# Patient Record
Sex: Female | Born: 1955 | Hispanic: No | Marital: Married | Smoking: Former smoker
Health system: Southern US, Community
[De-identification: ages and names within clinical notes are randomized; demographics above are authoritative.]

## PROBLEM LIST (undated history)

## (undated) DIAGNOSIS — M1990 Unspecified osteoarthritis, unspecified site: Secondary | ICD-10-CM

## (undated) DIAGNOSIS — K219 Gastro-esophageal reflux disease without esophagitis: Secondary | ICD-10-CM

## (undated) DIAGNOSIS — J45909 Unspecified asthma, uncomplicated: Secondary | ICD-10-CM

## (undated) HISTORY — PX: FOOT SURGERY: SHX648

## (undated) HISTORY — PX: TONSILLECTOMY: SUR1361

## (undated) HISTORY — PX: RHINOPLASTY: SUR1284

---

## 2010-09-14 ENCOUNTER — Ambulatory Visit: Payer: Self-pay | Admitting: Internal Medicine

## 2010-10-29 ENCOUNTER — Ambulatory Visit: Payer: Self-pay | Admitting: Internal Medicine

## 2011-03-01 ENCOUNTER — Emergency Department: Payer: Self-pay | Admitting: Emergency Medicine

## 2011-06-15 ENCOUNTER — Emergency Department: Payer: Self-pay | Admitting: Emergency Medicine

## 2011-09-30 DIAGNOSIS — G4700 Insomnia, unspecified: Secondary | ICD-10-CM | POA: Insufficient documentation

## 2011-09-30 DIAGNOSIS — M549 Dorsalgia, unspecified: Secondary | ICD-10-CM | POA: Insufficient documentation

## 2011-10-07 ENCOUNTER — Ambulatory Visit: Payer: Self-pay | Admitting: Internal Medicine

## 2013-04-15 ENCOUNTER — Ambulatory Visit: Payer: Self-pay | Admitting: Podiatry

## 2015-09-15 DIAGNOSIS — E782 Mixed hyperlipidemia: Secondary | ICD-10-CM | POA: Insufficient documentation

## 2016-07-11 ENCOUNTER — Ambulatory Visit (INDEPENDENT_AMBULATORY_CARE_PROVIDER_SITE_OTHER): Payer: Self-pay | Admitting: Vascular Surgery

## 2016-07-25 ENCOUNTER — Ambulatory Visit (INDEPENDENT_AMBULATORY_CARE_PROVIDER_SITE_OTHER): Payer: Self-pay | Admitting: Vascular Surgery

## 2018-09-01 ENCOUNTER — Telehealth: Payer: Self-pay | Admitting: Gastroenterology

## 2018-09-01 NOTE — Telephone Encounter (Signed)
Left vm for pt to call office and schedule apt for DR. Vanga Hemorrhoids, unspecified hemorrhoid type

## 2018-09-03 ENCOUNTER — Encounter (INDEPENDENT_AMBULATORY_CARE_PROVIDER_SITE_OTHER): Payer: Self-pay | Admitting: Vascular Surgery

## 2018-09-03 ENCOUNTER — Ambulatory Visit (INDEPENDENT_AMBULATORY_CARE_PROVIDER_SITE_OTHER): Payer: Managed Care, Other (non HMO) | Admitting: Vascular Surgery

## 2018-09-03 DIAGNOSIS — I872 Venous insufficiency (chronic) (peripheral): Secondary | ICD-10-CM | POA: Diagnosis not present

## 2018-09-03 DIAGNOSIS — I83819 Varicose veins of unspecified lower extremities with pain: Secondary | ICD-10-CM

## 2018-09-03 DIAGNOSIS — J4540 Moderate persistent asthma, uncomplicated: Secondary | ICD-10-CM

## 2018-09-09 ENCOUNTER — Encounter (INDEPENDENT_AMBULATORY_CARE_PROVIDER_SITE_OTHER): Payer: Self-pay | Admitting: Vascular Surgery

## 2018-09-09 DIAGNOSIS — I83819 Varicose veins of unspecified lower extremities with pain: Secondary | ICD-10-CM | POA: Insufficient documentation

## 2018-09-09 DIAGNOSIS — I872 Venous insufficiency (chronic) (peripheral): Secondary | ICD-10-CM | POA: Insufficient documentation

## 2018-09-09 DIAGNOSIS — J45909 Unspecified asthma, uncomplicated: Secondary | ICD-10-CM | POA: Insufficient documentation

## 2018-09-09 NOTE — Progress Notes (Signed)
MRN : [id number]  Kendra Hicks is a 63 y.o. (18-Aug-1955) female who presents with chief complaint of  Chief Complaint  Patient presents with  . Follow-up  .  History of Present Illness:   The patient returns for followup evaluation several years after the initial visit. The patient continues to have pain in the lower extremities with dependency. The pain is lessened with elevation. Graduated compression stockings, Class I (20-30 mmHg), have been worn but the stockings do not eliminate the leg pain. Over-the-counter analgesics do not improve the symptoms. The degree of discomfort continues to interfere with daily activities. The patient notes the pain in the legs is causing problems with daily exercise, at the workplace and even with household activities and maintenance such as standing in the kitchen preparing meals and doing dishes.   Venous ultrasound done 04/10/2016, shows normal deep venous system, no evidence of acute or chronic DVT.  Superficial reflux is present in the bilateral great saphenous veins  Current Meds  Medication Sig  . fluticasone (FLOVENT HFA) 110 MCG/ACT inhaler Inhale into the lungs 3 times/day as needed-between meals & bedtime.   . traZODone (DESYREL) 100 MG tablet Take by mouth at bedtime as needed.   . VENTOLIN HFA 108 (90 Base) MCG/ACT inhaler INHALE 1 PUFF INTO THE LUNGS AS NEEDED AS DIRECTED    History reviewed. No pertinent past medical history.  History reviewed. No pertinent surgical history.  Social History Social History   Tobacco Use  . Smoking status: Former Games developer  . Smokeless tobacco: Never Used  Substance Use Topics  . Alcohol use: Not on file  . Drug use: Not on file    Family History History reviewed. No pertinent family history.  No Known Allergies   REVIEW OF SYSTEMS (Negative unless checked)  Constitutional: [] Weight loss  [] Fever  [] Chills Cardiac: [] Chest pain   [] Chest pressure   [] Palpitations   [] Shortness of breath  when laying flat   [] Shortness of breath with exertion. Vascular:  [] Pain in legs with walking   [x] Pain in legs with standing  [] History of DVT   [] Phlebitis   [x] Swelling in legs   [x] Varicose veins   [] Non-healing ulcers Pulmonary:   [] Uses home oxygen   [] Productive cough   [] Hemoptysis   [] Wheeze  [] COPD   [] Asthma Neurologic:  [] Dizziness   [] Seizures   [] History of stroke   [] History of TIA  [] Aphasia   [] Vissual changes   [] Weakness or numbness in arm   [] Weakness or numbness in leg Musculoskeletal:   [] Joint swelling   [] Joint pain   [] Low back pain Hematologic:  [] Easy bruising  [] Easy bleeding   [] Hypercoagulable state   [] Anemic Gastrointestinal:  [] Diarrhea   [] Vomiting  [] Gastroesophageal reflux/heartburn   [] Difficulty swallowing. Genitourinary:  [] Chronic kidney disease   [] Difficult urination  [] Frequent urination   [] Blood in urine Skin:  [x] Rashes   [] Ulcers  Psychological:  [] History of anxiety   []  History of major depression.  Physical Examination  Vitals:   09/03/18 1344  BP: (!) 145/80  Pulse: 85  Resp: 16  Weight: 156 lb 3.2 oz (70.9 kg)  Height: 5\' 3"  (1.6 m)   Body mass index is 27.67 kg/m. Gen: WD/WN, NAD Head: [place]/AT, No temporalis wasting.  Ear/Nose/Throat: Hearing grossly intact, nares w/o erythema or drainage Eyes: PER, EOMI, sclera nonicteric.  Neck: Supple, no large masses.   Pulmonary:  Good air movement, no audible wheezing bilaterally, no use of accessory muscles.  Cardiac: RRR, no  JVD Vascular: Large varicosities present extensively greater than 10 mm bilaterally.  Mild venous stasis changes to the legs bilaterally.  2+ soft pitting edema Vessel Right Left  Radial Palpable Palpable  PT Palpable Palpable  DP Palpable Palpable  Gastrointestinal: Non-distended. No guarding/no peritoneal signs.  Musculoskeletal: M/S 5/5 throughout.  No deformity or atrophy.  Neurologic: CN 2-12 intact. Symmetrical.  Speech is fluent. Motor exam as listed  above. Psychiatric: Judgment intact, Mood & affect appropriate for pt's clinical situation. Dermatologic: Venous rashes no ulcers noted.  No changes consistent with cellulitis. Lymph : No lichenification or skin changes of chronic lymphedema.  CBC No results found for: WBC, HGB, HCT, MCV, PLT  BMET No results found for: NA, K, CL, CO2, GLUCOSE, BUN, CREATININE, CALCIUM, GFRNONAA, GFRAA CrCl cannot be calculated (No successful lab value found.).  COAG No results found for: INR, PROTIME  Radiology No results found.  Assessment/Plan 1. Varicose veins with pain Recommend  I have reviewed my previous  discussion with the patient regarding  varicose veins and why they cause symptoms. Patient will continue  wearing graduated compression stockings class 1 on a daily basis, beginning first thing in the morning and removing them in the evening.    In addition, behavioral modification including elevation during the day was again discussed and this will continue.  The patient has utilized over the counter pain medications and has been exercising.  However, at this time conservative therapy has not alleviated the patient's symptoms of leg pain and swelling  Recommend: laser ablation of the left and right great saphenous veins to eliminate the symptoms of pain and swelling of the lower extremities caused by the severe superficial venous reflux disease.  Left leg is worse and will be treated first.   2. Chronic venous insufficiency No surgery or intervention at this point in time.    I have had a long discussion with the patient regarding venous insufficiency and why it  causes symptoms. I have discussed with the patient the chronic skin changes that accompany venous insufficiency and the long term sequela such as infection and ulceration.  Patient will begin wearing graduated compression stockings class 1 (20-30 mmHg) or compression wraps on a daily basis a prescription was given. The patient  will put the stockings on first thing in the morning and removing them in the evening. The patient is instructed specifically not to sleep in the stockings.    In addition, behavioral modification including several periods of elevation of the lower extremities during the day will be continued. I have demonstrated that proper elevation is a position with the ankles at heart level.  The patient is instructed to begin routine exercise, especially walking on a daily basis   3. Moderate persistent chronic asthma without complication Continue pulmonary medications and aerosols as already ordered, these medications have been reviewed and there are no changes at this time.      Levora Dredge, MD  09/09/2018 5:09 PM

## 2018-09-11 ENCOUNTER — Encounter: Payer: Self-pay | Admitting: Gastroenterology

## 2018-09-11 ENCOUNTER — Encounter: Payer: Self-pay | Admitting: *Deleted

## 2018-09-11 ENCOUNTER — Ambulatory Visit (INDEPENDENT_AMBULATORY_CARE_PROVIDER_SITE_OTHER): Payer: Managed Care, Other (non HMO) | Admitting: Gastroenterology

## 2018-09-11 ENCOUNTER — Other Ambulatory Visit: Payer: Self-pay

## 2018-09-11 VITALS — BP 124/87 | HR 81 | Resp 17 | Ht 63.0 in | Wt 156.6 lb

## 2018-09-11 DIAGNOSIS — J301 Allergic rhinitis due to pollen: Secondary | ICD-10-CM | POA: Insufficient documentation

## 2018-09-11 DIAGNOSIS — K219 Gastro-esophageal reflux disease without esophagitis: Secondary | ICD-10-CM | POA: Insufficient documentation

## 2018-09-11 DIAGNOSIS — K625 Hemorrhage of anus and rectum: Secondary | ICD-10-CM

## 2018-09-11 NOTE — Progress Notes (Signed)
Kendra Repress, MD [address]  [address]  [place], [place] [address]  Main: [phone]  Fax: [phone]    Gastroenterology Consultation  Referring Provider:     Kandyce Rud, MD Primary Care Physician:  Kandyce Rud, MD Primary Gastroenterologist:  Dr. Arlyss Hicks Reason for Consultation:     Rectal bleeding        HPI:   Kendra Hicks is a 63 y.o. female referred by Dr. Kandyce Rud, MD  for consultation & management of rectal bleeding for more than 20 years.  She reports seeing bright red blood per rectum mostly on wiping and sometimes dripping in the toilet bowl, associated with leakage, mucus discharge, rectal pressure/discomfort.  She used to apply anti-hemorrhoidal creams but does not like them because she feels it is messy when she uses the creams. Patient had right anterior hemorrhoidectomy on 01/18/2011 at [facility].  She also reports soft, brown bowel movements or eating certain foods.  She does not have anemia.  She denies abdominal pain, bloating, constipation.  She tries to stay active, does hiking on a regular basis.  NSAIDs: Takes aspirin 325 mg or Advil when she does hiking for musculoskeletal pain  Antiplts/Anticoagulants/Anti thrombotics: None  GI Procedures:  Colonoscopy 10/08/2010 Reportedly normal She denies family history of GI malignancy, inflammatory bowel disease, celiac disease She does not smoke or drink alcohol  No past medical history on file.  No past surgical history on file.  Current Outpatient Medications:  .  fluticasone (FLOVENT HFA) 110 MCG/ACT inhaler, Inhale into the lungs 3 times/day as needed-between meals & bedtime. , Disp: , Rfl:  .  traZODone (DESYREL) 100 MG tablet, Take by mouth at bedtime as needed. , Disp: , Rfl:  .  VENTOLIN HFA 108 (90 Base) MCG/ACT inhaler, INHALE 1 PUFF INTO THE LUNGS AS NEEDED AS DIRECTED, Disp: , Rfl:   No family history on file.   Social History   Tobacco Use  . Smoking status:  Former Games developer  . Smokeless tobacco: Never Used  Substance Use Topics  . Alcohol use: Not on file  . Drug use: Not on file    Allergies as of 09/11/2018  . (No Known Allergies)    Review of Systems:    All systems reviewed and negative except where noted in HPI.   Physical Exam:  BP 124/87 (BP Location: Left Arm, Patient Position: Sitting, Cuff Size: Normal)   Pulse 81   Resp 17   Ht 5\' 3"  (1.6 m)   Wt 156 lb 9.6 oz (71 kg)   BMI 27.74 kg/m  No LMP recorded.  General:   Alert,  Well-developed, well-nourished, pleasant and cooperative in NAD Head:  Normocephalic and atraumatic. Eyes:  Sclera clear, no icterus.   Conjunctiva pink. Ears:  Normal auditory acuity. Nose:  No deformity, discharge, or lesions. Mouth:  No deformity or lesions,oropharynx pink & moist. Neck:  Supple; no masses or thyromegaly. Lungs:  Respirations even and unlabored.  Clear throughout to auscultation.   No wheezes, crackles, or rhonchi. No acute distress. Heart:  Regular rate and rhythm; no murmurs, clicks, rubs, or gallops. Abdomen:  Normal bowel sounds. Soft, non-tender and non-distended without masses, hepatosplenomegaly or hernias noted.  No guarding or rebound tenderness.   Rectal: Not performed Msk:  Symmetrical without gross deformities. Good, equal movement & strength bilaterally. Pulses:  Normal pulses noted. Extremities:  No clubbing or edema.  No cyanosis. Neurologic:  Alert and oriented x3;  grossly normal neurologically. Skin:  Intact without significant lesions or rashes. No jaundice. Psych:  Alert and cooperative. Normal mood and affect.  Imaging Studies: No abdominal imaging  Assessment and Plan:   Kendra Hicks is a 63 y.o. female with no significant past medical history, presents with several years history of bright red bleeding per rectum associated with rectal pain/discomfort, leakage.  Her symptoms are most likely secondary to symptomatic hemorrhoids.  However, her last  colonoscopy was 8 years ago.  Recommend flexible sigmoidoscopy to evaluate for any left colon/rectal lesions that can explain bright red bleeding per rectum other than hemorrhoids  Recommend flexible sigmoidoscopy under anesthesia, take left colon biopsies to rule out microscopic colitis Discussed with her about outpatient hemorrhoid ligation after flex sig Obtain colonoscopy report from 2012 Minimize intake of NSAID use   Follow up in 2 weeks   Kendra Repress, MD

## 2018-09-14 ENCOUNTER — Ambulatory Visit
Admission: RE | Admit: 2018-09-14 | Discharge: 2018-09-14 | Disposition: A | Discharge status: Home / Self Care | Payer: Managed Care, Other (non HMO) | Attending: Gastroenterology | Admitting: Gastroenterology

## 2018-09-14 ENCOUNTER — Ambulatory Visit: Payer: Managed Care, Other (non HMO) | Admitting: Certified Registered"

## 2018-09-14 ENCOUNTER — Encounter
Admission: RE | Disposition: A | Discharge status: Home / Self Care | Payer: Self-pay | Source: Home / Self Care | Attending: Gastroenterology

## 2018-09-14 ENCOUNTER — Encounter: Payer: Self-pay | Admitting: Student

## 2018-09-14 ENCOUNTER — Other Ambulatory Visit: Payer: Self-pay

## 2018-09-14 DIAGNOSIS — Z87891 Personal history of nicotine dependence: Secondary | ICD-10-CM | POA: Diagnosis not present

## 2018-09-14 DIAGNOSIS — K219 Gastro-esophageal reflux disease without esophagitis: Secondary | ICD-10-CM | POA: Diagnosis not present

## 2018-09-14 DIAGNOSIS — M1990 Unspecified osteoarthritis, unspecified site: Secondary | ICD-10-CM | POA: Diagnosis not present

## 2018-09-14 DIAGNOSIS — Z7951 Long term (current) use of inhaled steroids: Secondary | ICD-10-CM | POA: Diagnosis not present

## 2018-09-14 DIAGNOSIS — K644 Residual hemorrhoidal skin tags: Secondary | ICD-10-CM | POA: Insufficient documentation

## 2018-09-14 DIAGNOSIS — J45909 Unspecified asthma, uncomplicated: Secondary | ICD-10-CM | POA: Insufficient documentation

## 2018-09-14 DIAGNOSIS — K642 Third degree hemorrhoids: Secondary | ICD-10-CM | POA: Diagnosis not present

## 2018-09-14 DIAGNOSIS — K625 Hemorrhage of anus and rectum: Secondary | ICD-10-CM | POA: Insufficient documentation

## 2018-09-14 DIAGNOSIS — Z79899 Other long term (current) drug therapy: Secondary | ICD-10-CM | POA: Insufficient documentation

## 2018-09-14 DIAGNOSIS — K529 Noninfective gastroenteritis and colitis, unspecified: Secondary | ICD-10-CM | POA: Diagnosis not present

## 2018-09-14 HISTORY — PX: FLEXIBLE SIGMOIDOSCOPY: SHX5431

## 2018-09-14 HISTORY — DX: Unspecified asthma, uncomplicated: J45.909

## 2018-09-14 HISTORY — DX: Gastro-esophageal reflux disease without esophagitis: K21.9

## 2018-09-14 HISTORY — DX: Unspecified osteoarthritis, unspecified site: M19.90

## 2018-09-14 SURGERY — SIGMOIDOSCOPY, FLEXIBLE
Anesthesia: General

## 2018-09-14 MED ORDER — PROPOFOL 10 MG/ML IV BOLUS
INTRAVENOUS | Status: DC | PRN
  Administered 2018-09-14 (×3): 50 mg via INTRAVENOUS

## 2018-09-14 MED ORDER — SODIUM CHLORIDE 0.9 % IV SOLN
INTRAVENOUS | Status: DC
  Administered 2018-09-14: 1000 mL via INTRAVENOUS

## 2018-09-14 NOTE — Transfer of Care (Signed)
Immediate Anesthesia Transfer of Care Note  Patient: Kendra Hicks  Procedure(s) Performed: FLEXIBLE SIGMOIDOSCOPY (N/A )  Patient Location: Endoscopy Unit  Anesthesia Type:General  Level of Consciousness: drowsy and responds to stimulation  Airway & Oxygen Therapy: Patient Spontanous Breathing and Patient connected to nasal cannula oxygen  Post-op Assessment: Report given to RN and Post -op Vital signs reviewed and stable  Post vital signs: Reviewed and stable  Last Vitals:  Vitals Value Taken Time  BP 100/72 09/14/2018 11:12 AM  Temp 36.1 C 09/14/2018 11:11 AM  Pulse 78 09/14/2018 11:13 AM  Resp 13 09/14/2018 11:13 AM  SpO2 99 % 09/14/2018 11:13 AM  Vitals shown include unvalidated device data.  Last Pain:  Vitals:   09/14/18 1111  TempSrc: Tympanic  PainSc: 0-No pain         Complications: No apparent anesthesia complications

## 2018-09-14 NOTE — Op Note (Signed)
[facility] Gastroenterology Patient Name: Kendra Hicks Procedure Date: 09/14/2018 10:24 AM MRN: [id number] Account #: [id number] Date of Birth: 1956/06/20 Admit Type: Outpatient Age: 63 Room: [facility] ENDO ROOM 3 Gender: Female Note Status: Finalized Procedure:            Flexible Sigmoidoscopy Indications:          Rectal hemorrhage, Chronic diarrhea Providers:            Lin Landsman MD, MD Referring MD:         Caprice Renshaw MD (Referring MD) Medicines:            Monitored Anesthesia Care Complications:        No immediate complications. Estimated blood loss: None. Procedure:            Pre-Anesthesia Assessment:                       - Prior to the procedure, a History and Physical was                        performed, and patient medications and allergies were                        reviewed. The patient is competent. The risks and                        benefits of the procedure and the sedation options and                        risks were discussed with the patient. All questions                        were answered and informed consent was obtained.                        Patient identification and proposed procedure were                        verified by the physician, the nurse, the                        anesthesiologist, the anesthetist and the technician in                        the pre-procedure area in the procedure room in the                        endoscopy suite. Mental Status Examination: alert and                        oriented. Airway Examination: normal oropharyngeal                        airway and neck mobility. Respiratory Examination:                        clear to auscultation. CV Examination: normal.                        Prophylactic Antibiotics: The patient does not require  prophylactic antibiotics. Prior Anticoagulants: The                        patient has taken no previous anticoagulant or                         antiplatelet agents. ASA Grade Assessment: III - A                        patient with severe systemic disease. After reviewing                        the risks and benefits, the patient was deemed in                        satisfactory condition to undergo the procedure. The                        anesthesia plan was to use monitored anesthesia care                        (MAC). Immediately prior to administration of                        medications, the patient was re-assessed for adequacy                        to receive sedatives. The heart rate, respiratory rate,                        oxygen saturations, blood pressure, adequacy of                        pulmonary ventilation, and response to care were                        monitored throughout the procedure. The physical status                        of the patient was re-assessed after the procedure.                       After obtaining informed consent, the scope was passed                        under direct vision. The Endoscope was introduced                        through the anus and advanced to the the descending                        colon. The flexible sigmoidoscopy was accomplished                        without difficulty. The patient tolerated the procedure                        well. The quality of the bowel preparation was adequate. Findings:      Hemorrhoids were found on perianal exam.  Normal mucosa was found in the rectum, in the recto-sigmoid colon, in       the sigmoid colon and in the descending colon. Biopsies for histology       were taken with a cold forceps for evaluation of microscopic colitis.      Non-bleeding external and internal hemorrhoids were found during       retroflexion and during perianal exam. The hemorrhoids were large and       Grade III (internal hemorrhoids that prolapse but require manual       reduction). Impression:           - Hemorrhoids found on  perianal exam.                       - Normal mucosa in the rectum, in the recto-sigmoid                        colon, in the sigmoid colon and in the descending                        colon. Biopsied.                       - Non-bleeding external and internal hemorrhoids. Recommendation:       - Discharge patient to home (with escort).                       - Await pathology results.                       - Return to my office as previously scheduled for                        hemorrhoid ligation.                       - Resume previous diet today. Procedure Code(s):    --- Professional ---                       [phone], Sigmoidoscopy, flexible; with biopsy, single or                        multiple Diagnosis Code(s):    --- Professional ---                       K64.2, Third degree hemorrhoids                       K62.5, Hemorrhage of anus and rectum                       K52.9, Noninfective gastroenteritis and colitis,                        unspecified CPT copyright 2018 American Medical Association. All rights reserved. The codes documented in this report are preliminary and upon coder review may  be revised to meet current compliance requirements. Dr. Ulyess Mort Lin Landsman MD, MD 09/14/2018 11:12:52 AM This report has been signed electronically. Number of Addenda: 0 Note Initiated On: 09/14/2018 10:24 AM Total Procedure Duration: 0 hours 8 minutes 45 seconds  [facility]

## 2018-09-14 NOTE — H&P (Signed)
Arlyss Repress, MD [address]  [address]  [place], [place] [address]  Main: [phone]  Fax: [phone] Pager: [phone]  Primary Care Physician:  Kandyce Rud, MD Primary Gastroenterologist:  Dr. Arlyss Repress  Pre-Procedure History & Physical: HPI:  Kendra Hicks is a 64 y.o. female is here for an flexible sigmoidoscopy.   Past Medical History:  Diagnosis Date  . Arthritis   . Asthma   . GERD (gastroesophageal reflux disease)     Past Surgical History:  Procedure Laterality Date  . FOOT SURGERY    . RHINOPLASTY    . TONSILLECTOMY      Prior to Admission medications   Medication Sig Start Date End Date Taking? Authorizing Provider  traZODone (DESYREL) 100 MG tablet Take by mouth at bedtime as needed.  05/12/18  Yes [provider]  fluticasone (FLOVENT HFA) 110 MCG/ACT inhaler Inhale into the lungs 3 times/day as needed-between meals & bedtime.  05/12/18   [provider]  VENTOLIN HFA 108 (90 Base) MCG/ACT inhaler INHALE 1 PUFF INTO THE LUNGS AS NEEDED AS DIRECTED 07/18/18   [provider]    Allergies as of 09/11/2018  . (No Known Allergies)    History reviewed. No pertinent family history.  Social History   Socioeconomic History  . Marital status: Married    Spouse name: Not on file  . Number of children: Not on file  . Years of education: Not on file  . Highest education level: Not on file  Occupational History  . Not on file  Social Needs  . Financial resource strain: Not on file  . Food insecurity:    Worry: Not on file    Inability: Not on file  . Transportation needs:    Medical: Not on file    Non-medical: Not on file  Tobacco Use  . Smoking status: Former Games developer  . Smokeless tobacco: Never Used  Substance and Sexual Activity  . Alcohol use: Never    Frequency: Never  . Drug use: Never  . Sexual activity: Not on file  Lifestyle  . Physical activity:    Days per week: Not on file    Minutes  per session: Not on file  . Stress: Not on file  Relationships  . Social connections:    Talks on phone: Not on file    Gets together: Not on file    Attends religious service: Not on file    Active member of club or organization: Not on file    Attends meetings of clubs or organizations: Not on file    Relationship status: Not on file  . Intimate partner violence:    Fear of current or ex partner: Not on file    Emotionally abused: Not on file    Physically abused: Not on file    Forced sexual activity: Not on file  Other Topics Concern  . Not on file  Social History Narrative  . Not on file    Review of Systems: See HPI, otherwise negative ROS  Physical Exam: BP 100/72   Pulse 80   Temp (!) 97 F (36.1 C) (Tympanic)   Resp 13   Ht 5\' 3"  (1.6 m)   Wt 156 lb (70.8 kg)   SpO2 99%   BMI 27.63 kg/m  General:   Alert,  pleasant and cooperative in NAD Head:  Normocephalic and atraumatic. Neck:  Supple; no masses or thyromegaly. Lungs:  Clear throughout to auscultation.    Heart:  Regular rate and rhythm. Abdomen:  Soft, nontender and nondistended. Normal bowel sounds, without guarding, and without rebound.   Neurologic:  Alert and  oriented x4;  grossly normal neurologically.  Impression/Plan: Kendra Hicks is here for an flexible sigmoidoscopy to be performed for rectal bleeding  Risks, benefits, limitations, and alternatives regarding  flexible sigmoidoscopy have been reviewed with the patient.  Questions have been answered.  All parties agreeable.   Lannette Donath, MD  09/14/2018, 11:18 AM

## 2018-09-14 NOTE — Anesthesia Preprocedure Evaluation (Signed)
Anesthesia Evaluation  Patient identified by MRN, date of birth, ID band Patient awake    Reviewed: Allergy & Precautions, H&P , NPO status , Patient's Chart, lab work & pertinent test results  History of Anesthesia Complications Negative for: history of anesthetic complications  Airway Mallampati: III  TM Distance: <3 FB Neck ROM: full    Dental  (+) Chipped, Implants   Pulmonary asthma ,  Signs and symptoms suggestive of sleep apnea            Cardiovascular Exercise Tolerance: Good (-) angina(-) Past MI and (-) DOE negative cardio ROS       Neuro/Psych negative neurological ROS  negative psych ROS   GI/Hepatic Neg liver ROS, GERD  Medicated and Controlled,  Endo/Other  negative endocrine ROS  Renal/GU negative Renal ROS  negative genitourinary   Musculoskeletal   Abdominal   Peds  Hematology negative hematology ROS (+)   Anesthesia Other Findings Past Medical History: No date: Arthritis No date: Asthma No date: GERD (gastroesophageal reflux disease)  Past Surgical History: No date: FOOT SURGERY No date: RHINOPLASTY No date: TONSILLECTOMY  BMI    Body Mass Index:  27.63 kg/m      Reproductive/Obstetrics negative OB ROS                             Anesthesia Physical Anesthesia Plan  ASA: III  Anesthesia Plan: General   Post-op Pain Management:    Induction: Intravenous  PONV Risk Score and Plan: Propofol infusion and TIVA  Airway Management Planned: Natural Airway and Nasal Cannula  Additional Equipment:   Intra-op Plan:   Post-operative Plan:   Informed Consent: I have reviewed the patients History and Physical, chart, labs and discussed the procedure including the risks, benefits and alternatives for the proposed anesthesia with the patient or authorized representative who has indicated his/her understanding and acceptance.     Dental Advisory  Given  Plan Discussed with: Anesthesiologist, CRNA and Surgeon  Anesthesia Plan Comments: (Patient consented for risks of anesthesia including but not limited to:  - adverse reactions to medications - risk of intubation if required - damage to teeth, lips or other oral mucosa - sore throat or hoarseness - Damage to heart, brain, lungs or loss of life  Patient voiced understanding.)        Anesthesia Quick Evaluation

## 2018-09-14 NOTE — Anesthesia Post-op Follow-up Note (Signed)
Anesthesia QCDR form completed.        

## 2018-09-15 ENCOUNTER — Encounter: Payer: Self-pay | Admitting: Gastroenterology

## 2018-09-15 ENCOUNTER — Ambulatory Visit: Payer: Managed Care, Other (non HMO) | Admitting: Gastroenterology

## 2018-09-15 VITALS — BP 135/87 | HR 73 | Resp 17 | Ht 63.0 in | Wt 159.4 lb

## 2018-09-15 DIAGNOSIS — K625 Hemorrhage of anus and rectum: Secondary | ICD-10-CM

## 2018-09-15 DIAGNOSIS — K642 Third degree hemorrhoids: Secondary | ICD-10-CM | POA: Diagnosis not present

## 2018-09-15 LAB — SURGICAL PATHOLOGY

## 2018-09-15 NOTE — Progress Notes (Signed)
PROCEDURE NOTE: The patient presents with symptomatic grade 3 hemorrhoids, unresponsive to maximal medical therapy, requesting rubber band ligation of her hemorrhoidal disease.  All risks, benefits and alternative forms of therapy were described and informed consent was obtained.   The decision was made to band the RP internal hemorrhoid, and the [facility] System was used to perform band ligation without complication.  Digital anorectal examination was then performed to assure proper positioning of the band, and to adjust the banded tissue as required.  The patient was discharged home without pain or other issues.  Dietary and behavioral recommendations were given and (if necessary - prescriptions were given), along with follow-up instructions.  The patient will return 2 weeks for follow-up and possible additional banding as required.  No complications were encountered and the patient tolerated the procedure well.

## 2018-09-15 NOTE — Progress Notes (Signed)
Kendra Repress, MD [address]  [address]  [place], [place] [address]  Main: [phone]  Fax: [phone]    Gastroenterology Consultation  Referring Provider:     Kandyce Rud, MD Primary Care Physician:  Kendra Rud, MD Primary Gastroenterologist:  Dr. Arlyss Hicks Reason for Consultation:     Rectal bleeding        HPI:   Kendra Hicks is a 63 y.o. female referred by Dr. Kandyce Rud, MD  for consultation & management of rectal bleeding for more than 20 years.  She reports seeing bright red blood per rectum mostly on wiping and sometimes dripping in the toilet bowl, associated with leakage, mucus discharge, rectal pressure/discomfort.  She used to apply anti-hemorrhoidal creams but does not like them because she feels it is messy when she uses the creams. Patient had right anterior hemorrhoidectomy on 01/18/2011 at [facility].  She also reports soft, brown bowel movements or eating certain foods.  She does not have anemia.  She denies abdominal pain, bloating, constipation.  She tries to stay active, does hiking on a regular basis.  NSAIDs: Takes aspirin 325 mg or Advil when she does hiking for musculoskeletal pain  Antiplts/Anticoagulants/Anti thrombotics: None  GI Procedures:  Colonoscopy 10/08/2010 Reportedly normal  Flexible sigmoidoscopy 09/14/2018 - Hemorrhoids found on perianal exam. - Normal mucosa in the rectum, in the recto-sigmoid colon, in the sigmoid colon and in the descending colon. Biopsied. - Non-bleeding external and internal hemorrhoids.  DIAGNOSIS:  A. COLON, LEFT, RANDOM; BIOPSY:  - BENIGN COLONIC MUCOSA WITH FOCAL REACTIVE LYMPHOID AGGREGATES.  - NEGATIVE FOR ACTIVE MUCOSAL INFLAMMATION AND LYMPHOCYTIC/MICROSCOPIC  COLITIS.  She denies family history of GI malignancy, inflammatory bowel disease, celiac disease She does not smoke or drink alcohol  Follow-up visit 09/15/2018 Patient underwent flexible sigmoidoscopy which was  unremarkable, found to have large internal/external hemorrhoids which is a source of her rectal bleeding.  The random colon biopsies came back unremarkable.  I recommended hemorrhoid ligation, patient is here today to undergo hemorrhoid ligation  Past Medical History:  Diagnosis Date  . Arthritis   . Asthma   . GERD (gastroesophageal reflux disease)     Past Surgical History:  Procedure Laterality Date  . FLEXIBLE SIGMOIDOSCOPY N/A 09/14/2018   Procedure: FLEXIBLE SIGMOIDOSCOPY;  Surgeon: Kendra Reil, MD;  Location: [facility] ENDOSCOPY;  Service: Gastroenterology;  Laterality: N/A;  . FOOT SURGERY    . RHINOPLASTY    . TONSILLECTOMY      Current Outpatient Medications:  .  fluticasone (FLOVENT HFA) 110 MCG/ACT inhaler, Inhale into the lungs 3 times/day as needed-between meals & bedtime. , Disp: , Rfl:  .  traZODone (DESYREL) 100 MG tablet, Take by mouth at bedtime as needed. , Disp: , Rfl:  .  VENTOLIN HFA 108 (90 Base) MCG/ACT inhaler, INHALE 1 PUFF INTO THE LUNGS AS NEEDED AS DIRECTED, Disp: , Rfl:   No family history on file.   Social History   Tobacco Use  . Smoking status: Former Games developer  . Smokeless tobacco: Never Used  Substance Use Topics  . Alcohol use: Never    Frequency: Never  . Drug use: Never    Allergies as of 09/15/2018  . (No Known Allergies)    Review of Systems:    All systems reviewed and negative except where noted in HPI.   Physical Exam:  BP 135/87 (BP Location: Left Arm, Patient Position: Sitting, Cuff Size: Normal)   Pulse 73   Resp 17  Ht 5\' 3"  (1.6 m)   Wt 159 lb 6.4 oz (72.3 kg)   BMI 28.24 kg/m  No LMP recorded. Patient is postmenopausal.  General:   Alert,  Well-developed, well-nourished, pleasant and cooperative in NAD Head:  Normocephalic and atraumatic. Eyes:  Sclera clear, no icterus.   Conjunctiva pink. Ears:  Normal auditory acuity. Nose:  No deformity, discharge, or lesions. Mouth:  No deformity or lesions,oropharynx  pink & moist. Neck:  Supple; no masses or thyromegaly. Lungs:  Respirations even and unlabored.  Clear throughout to auscultation.   No wheezes, crackles, or rhonchi. No acute distress. Heart:  Regular rate and rhythm; no murmurs, clicks, rubs, or gallops. Abdomen:  Normal bowel sounds. Soft, non-tender and non-distended without masses, hepatosplenomegaly or hernias noted.  No guarding or rebound tenderness.   Rectal: Not performed Msk:  Symmetrical without gross deformities. Good, equal movement & strength bilaterally. Pulses:  Normal pulses noted. Extremities:  No clubbing or edema.  No cyanosis. Neurologic:  Alert and oriented x3;  grossly normal neurologically. Skin:  Intact without significant lesions or rashes. No jaundice. Psych:  Alert and cooperative. Normal mood and affect.  Imaging Studies: No abdominal imaging  Assessment and Plan:   [name] is a 63 y.o. female with no significant past medical history, presents for follow-up of several years history of bright red bleeding per rectum associated with rectal pain/discomfort, leakage.  Her symptoms are most likely secondary to symptomatic hemorrhoids.  Her last colonoscopy was 8 years ago, reportedly normal.  Therefore, she underwent flexible sigmoidoscopy did not reveal any left-sided lesions other than large internal and external hemorrhoids.  Symptomatic hemorrhoids leading to rectal bleeding Consent obtained Perform hemorrhoid ligation today Minimize intake of NSAID use  Screening colonoscopy Recommend full screening colonoscopy in 2022  Follow up in 2 weeks   Kendra Repress, MD

## 2018-09-16 NOTE — Anesthesia Postprocedure Evaluation (Signed)
Anesthesia Post Note  Patient: Kendra Hicks  Procedure(s) Performed: FLEXIBLE SIGMOIDOSCOPY (N/A )  Patient location during evaluation: Endoscopy Anesthesia Type: General Level of consciousness: awake and alert Pain management: pain level controlled Vital Signs Assessment: post-procedure vital signs reviewed and stable Respiratory status: spontaneous breathing, nonlabored ventilation, respiratory function stable and patient connected to nasal cannula oxygen Cardiovascular status: blood pressure returned to baseline and stable Postop Assessment: no apparent nausea or vomiting Anesthetic complications: no     Last Vitals:  Vitals:   09/14/18 1111 09/14/18 1212  BP: 100/72 132/70  Pulse: 80   Resp: 13   Temp: (!) 36.1 C   SpO2: 99%     Last Pain:  Vitals:   09/15/18 0744  TempSrc:   PainSc: 0-No pain                 Cleda Mccreedy Montzerrat Brunell

## 2018-10-01 ENCOUNTER — Encounter: Payer: Self-pay | Admitting: Gastroenterology

## 2018-10-01 ENCOUNTER — Ambulatory Visit: Payer: Managed Care, Other (non HMO) | Admitting: Gastroenterology

## 2018-10-01 ENCOUNTER — Other Ambulatory Visit: Payer: Self-pay

## 2018-10-01 VITALS — BP 134/81 | HR 72 | Resp 17 | Ht 63.0 in | Wt 157.6 lb

## 2018-10-01 DIAGNOSIS — K642 Third degree hemorrhoids: Secondary | ICD-10-CM

## 2018-10-01 NOTE — Progress Notes (Signed)
PROCEDURE NOTE: The patient presents with symptomatic grade 3 hemorrhoids, unresponsive to maximal medical therapy, requesting rubber band ligation of his/her hemorrhoidal disease.  All risks, benefits and alternative forms of therapy were described and informed consent was obtained.  The decision was made to band the RA internal hemorrhoid, and the [facility] System was used to perform band ligation without complication.  Digital anorectal examination was then performed to assure proper positioning of the band, and to adjust the banded tissue as required.  The patient was discharged home without pain or other issues.  Dietary and behavioral recommendations were given and (if necessary - prescriptions were given), along with follow-up instructions.  The patient will return 2 weeks for follow-up and possible additional banding as required.  No complications were encountered and the patient tolerated the procedure well.    

## 2018-10-08 ENCOUNTER — Telehealth: Payer: Self-pay | Admitting: Gastroenterology

## 2018-10-08 NOTE — Telephone Encounter (Signed)
l/m appt was cancelled due to the corona virus & to call back to r/s in 2-3 mon per Dr Allegra Lai

## 2018-10-13 ENCOUNTER — Ambulatory Visit: Payer: Managed Care, Other (non HMO) | Admitting: Gastroenterology

## 2018-11-05 ENCOUNTER — Other Ambulatory Visit (INDEPENDENT_AMBULATORY_CARE_PROVIDER_SITE_OTHER): Payer: Managed Care, Other (non HMO) | Admitting: Vascular Surgery

## 2018-11-09 ENCOUNTER — Encounter (INDEPENDENT_AMBULATORY_CARE_PROVIDER_SITE_OTHER): Payer: Managed Care, Other (non HMO)

## 2018-12-04 ENCOUNTER — Ambulatory Visit: Payer: Managed Care, Other (non HMO) | Admitting: Gastroenterology

## 2018-12-10 ENCOUNTER — Other Ambulatory Visit (INDEPENDENT_AMBULATORY_CARE_PROVIDER_SITE_OTHER): Payer: Managed Care, Other (non HMO) | Admitting: Vascular Surgery

## 2018-12-15 ENCOUNTER — Encounter (INDEPENDENT_AMBULATORY_CARE_PROVIDER_SITE_OTHER): Payer: Managed Care, Other (non HMO)

## 2018-12-29 ENCOUNTER — Telehealth: Payer: Self-pay | Admitting: Gastroenterology

## 2018-12-29 NOTE — Telephone Encounter (Signed)
I called patient & l/m to call & schedule her hemorrhoid banding (on waiting list).

## 2018-12-31 ENCOUNTER — Ambulatory Visit: Payer: Managed Care, Other (non HMO) | Admitting: Gastroenterology

## 2019-01-12 ENCOUNTER — Ambulatory Visit: Payer: Managed Care, Other (non HMO) | Admitting: Gastroenterology

## 2019-02-11 ENCOUNTER — Ambulatory Visit: Payer: Managed Care, Other (non HMO) | Admitting: Gastroenterology

## 2019-02-16 ENCOUNTER — Ambulatory Visit: Payer: Managed Care, Other (non HMO) | Admitting: Gastroenterology

## 2020-11-10 ENCOUNTER — Other Ambulatory Visit: Payer: Self-pay | Admitting: Family Medicine

## 2020-11-10 DIAGNOSIS — Z1231 Encounter for screening mammogram for malignant neoplasm of breast: Secondary | ICD-10-CM

## 2020-11-21 ENCOUNTER — Other Ambulatory Visit: Payer: Self-pay

## 2020-11-21 ENCOUNTER — Ambulatory Visit
Admission: RE | Admit: 2020-11-21 | Discharge: 2020-11-21 | Disposition: A | Discharge status: Home / Self Care | Payer: 59 | Source: Ambulatory Visit | Attending: Family Medicine | Admitting: Family Medicine

## 2020-11-21 DIAGNOSIS — Z1231 Encounter for screening mammogram for malignant neoplasm of breast: Secondary | ICD-10-CM | POA: Diagnosis present

## 2020-11-22 ENCOUNTER — Inpatient Hospital Stay
Admission: RE | Admit: 2020-11-22 | Discharge: 2020-11-22 | Disposition: A | Discharge status: Home / Self Care | Payer: Self-pay | Source: Ambulatory Visit | Attending: *Deleted | Admitting: *Deleted

## 2020-11-22 ENCOUNTER — Other Ambulatory Visit: Payer: Self-pay | Admitting: *Deleted

## 2020-11-22 DIAGNOSIS — Z1231 Encounter for screening mammogram for malignant neoplasm of breast: Secondary | ICD-10-CM

## 2022-01-09 ENCOUNTER — Other Ambulatory Visit: Payer: Self-pay | Admitting: Family Medicine

## 2022-01-09 DIAGNOSIS — Z1231 Encounter for screening mammogram for malignant neoplasm of breast: Secondary | ICD-10-CM

## 2022-01-10 ENCOUNTER — Ambulatory Visit
Admission: RE | Admit: 2022-01-10 | Discharge: 2022-01-10 | Disposition: A | Discharge status: Home / Self Care | Payer: 59 | Source: Ambulatory Visit | Attending: Family Medicine | Admitting: Family Medicine

## 2022-01-10 DIAGNOSIS — Z1231 Encounter for screening mammogram for malignant neoplasm of breast: Secondary | ICD-10-CM

## 2022-03-20 ENCOUNTER — Other Ambulatory Visit (INDEPENDENT_AMBULATORY_CARE_PROVIDER_SITE_OTHER): Payer: Self-pay | Admitting: Nurse Practitioner

## 2022-03-20 DIAGNOSIS — I83813 Varicose veins of bilateral lower extremities with pain: Secondary | ICD-10-CM

## 2022-03-21 ENCOUNTER — Ambulatory Visit (INDEPENDENT_AMBULATORY_CARE_PROVIDER_SITE_OTHER): Payer: 59

## 2022-03-21 ENCOUNTER — Encounter (INDEPENDENT_AMBULATORY_CARE_PROVIDER_SITE_OTHER): Payer: Self-pay | Admitting: Nurse Practitioner

## 2022-03-21 ENCOUNTER — Ambulatory Visit (INDEPENDENT_AMBULATORY_CARE_PROVIDER_SITE_OTHER): Payer: 59 | Admitting: Nurse Practitioner

## 2022-03-21 VITALS — BP 134/83 | HR 79 | Resp 17 | Ht 63.0 in | Wt 155.0 lb

## 2022-03-21 DIAGNOSIS — I83813 Varicose veins of bilateral lower extremities with pain: Secondary | ICD-10-CM

## 2022-03-21 DIAGNOSIS — E782 Mixed hyperlipidemia: Secondary | ICD-10-CM | POA: Diagnosis not present

## 2022-04-14 ENCOUNTER — Encounter (INDEPENDENT_AMBULATORY_CARE_PROVIDER_SITE_OTHER): Payer: Self-pay | Admitting: Nurse Practitioner

## 2022-04-14 NOTE — Progress Notes (Signed)
Subjective:    Patient ID: Kendra Hicks, female    DOB: 11/16/1955, 66 y.o.   MRN: [id number] Chief Complaint  Patient presents with   Establish Care    Referred by Dr Baldemar Lenis    The patient has presented as a referral from Dr. Loney Hering regards to varicose veins.  Prior to [place] she was scheduled to have been treated but due to COVID the appointment was canceled.  She notes that she has a painful vein in the posterior left leg and there is a dull heavy pain.  She also notes some swelling as well.  She also has notable tender varicosities on the right lower extremity.  The patient has been engaging in conservative therapy since the original treatment..  These are not able to completely resolve the symptoms and issues.  The pain and discomfort that she experiences can cause some difficulties with ADLs.  Today noninvasive studies show no evidence of DVT or superficial thrombophlebitis bilaterally.  There is deep venous insufficiency bilaterally.  The patient has extensive venous reflux throughout the left lower extremity the left great saphenous vein, as well as reflux noted in the great saphenous vein    Review of Systems  Cardiovascular:  Positive for leg swelling.  All other systems reviewed and are negative.      Objective:   Physical Exam Vitals reviewed.  HENT:     Head: Normocephalic.  Cardiovascular:     Rate and Rhythm: Normal rate.     Pulses: Normal pulses.  Pulmonary:     Effort: Pulmonary effort is normal.  Skin:    General: Skin is warm and dry.  Neurological:     Mental Status: She is alert and oriented to person, place, and time.  Psychiatric:        Mood and Affect: Mood normal.        Behavior: Behavior normal.        Thought Content: Thought content normal.        Judgment: Judgment normal.     BP 134/83 (BP Location: Left Arm)   Pulse 79   Resp 17   Ht 5\' 3"  (1.6 m)   Wt 155 lb (70.3 kg)   BMI 27.46 kg/m   Past Medical History:  Diagnosis Date    Arthritis    Asthma    GERD (gastroesophageal reflux disease)     Social History   Socioeconomic History   Marital status: Married    Spouse name: Not on file   Number of children: Not on file   Years of education: Not on file   Highest education level: Not on file  Occupational History   Not on file  Tobacco Use   Smoking status: Former   Smokeless tobacco: Never  Vaping Use   Vaping Use: Never used  Substance and Sexual Activity   Alcohol use: Never   Drug use: Never   Sexual activity: Not on file  Other Topics Concern   Not on file  Social History Narrative   Not on file   Social Determinants of Health   Financial Resource Strain: Not on file  Food Insecurity: Not on file  Transportation Needs: Not on file  Physical Activity: Not on file  Stress: Not on file  Social Connections: Not on file  Intimate Partner Violence: Not on file    Past Surgical History:  Procedure Laterality Date   FLEXIBLE SIGMOIDOSCOPY N/A 09/14/2018   Procedure: FLEXIBLE SIGMOIDOSCOPY;  Surgeon: Lin Landsman, MD;  Location: [facility] ENDOSCOPY;  Service: Gastroenterology;  Laterality: N/A;   FOOT SURGERY     RHINOPLASTY     TONSILLECTOMY      Family History  Problem Relation Age of Onset   Breast cancer Mother     No Known Allergies      No data to display            CMP  No results found for: "NA", "K", "CL", "CO2", "GLUCOSE", "BUN", "CREATININE", "CALCIUM", "PROT", "ALBUMIN", "AST", "ALT", "ALKPHOS", "BILITOT", "GFRNONAA", "GFRAA"   No results found.     Assessment & Plan:   1. Varicose veins of bilateral lower extremities with pain Recommend  I have reviewed my  discussion with the patient regarding  varicose veins and why they cause symptoms. Patient will continue  wearing graduated compression stockings class 1 on a daily basis, beginning first thing in the morning and removing them in the evening.    In addition, behavioral modification including  elevation during the day was again discussed and this will continue.  The patient has utilized over the counter pain medications and has been exercising.  However, at this time conservative therapy has not alleviated the patient's symptoms of leg pain and swelling  Recommend: laser ablation of the left then right great saphenous veins to eliminate the symptoms of pain and swelling of the lower extremities caused by the severe superficial venous reflux disease.   - VAS [place] LOWER EXTREMITY VENOUS REFLUX  2. Mixed hyperlipidemia Continue statin as ordered and reviewed, no changes at this time    Current Outpatient Medications on File Prior to Visit  Medication Sig Dispense Refill   fluticasone (FLOVENT HFA) 110 MCG/ACT inhaler Inhale into the lungs 3 times/day as needed-between meals & bedtime.      Olopatadine HCl 0.2 % SOLN INT 1 GTT AEY ONCE D     VENTOLIN HFA 108 (90 Base) MCG/ACT inhaler INHALE 1 PUFF INTO THE LUNGS AS NEEDED AS DIRECTED     No current facility-administered medications on file prior to visit.    There are no Patient Instructions on file for this visit. No follow-ups on file.   Georgiana Spinner, NP

## 2022-05-18 ENCOUNTER — Encounter (INDEPENDENT_AMBULATORY_CARE_PROVIDER_SITE_OTHER): Payer: Self-pay

## 2022-05-20 ENCOUNTER — Telehealth (INDEPENDENT_AMBULATORY_CARE_PROVIDER_SITE_OTHER): Payer: Self-pay | Admitting: Vascular Surgery

## 2022-05-20 ENCOUNTER — Encounter (INDEPENDENT_AMBULATORY_CARE_PROVIDER_SITE_OTHER): Payer: Self-pay

## 2022-05-20 NOTE — Telephone Encounter (Signed)
Spoke with pt regarding response to [employer]. Pt was to have bilateral laser ablations performed. Unfortunately, the order and prior authorization were delayed until today as I was unaware the need for the procedure. I submitted a PA for the procedures and then called and spoke with patient. I explained the situation as far as waiting on PA to come in and advised that I had blocked 2 spots on the schedule with Dr. Delana Meyer for the patient to have the procedures. The patient informed me that she will be traveling to [place] later this week and then traveling to [place] in January for 3 months. Unfortunately, after consulting Dr. Delana Meyer, the patient was told that she would have to wait at least 6 weeks before taking a flight of that length due to the risk of blood clots. Patient was understandably upset but I assured her that she would be first on my list come March when she is back from her trip. I advised patient to send a MyChart message to me alerting me of her return date and I will submit a PA then. Patient acknowledged and nothing further is needed at this point. I will await a message from patient in March and proceed from there.

## 2022-05-20 NOTE — Telephone Encounter (Signed)
done

## 2022-06-18 NOTE — Telephone Encounter (Signed)
Patient is very upset and would like to speak management.

## 2023-12-14 IMAGING — MG MM DIGITAL SCREENING BILAT W/ TOMO AND CAD
8 series · 8 of 24 positions shown · non-contrast
Comparison: Previous exam(s).

CLINICAL DATA: Screening.

EXAM:
DIGITAL SCREENING BILATERAL MAMMOGRAM WITH TOMOSYNTHESIS AND CAD
TECHNIQUE: Bilateral screening digital craniocaudal and mediolateral oblique
mammograms were obtained. Bilateral screening digital breast
tomosynthesis was performed. The images were evaluated with
computer-aided detection.

[R CC synth-2D]
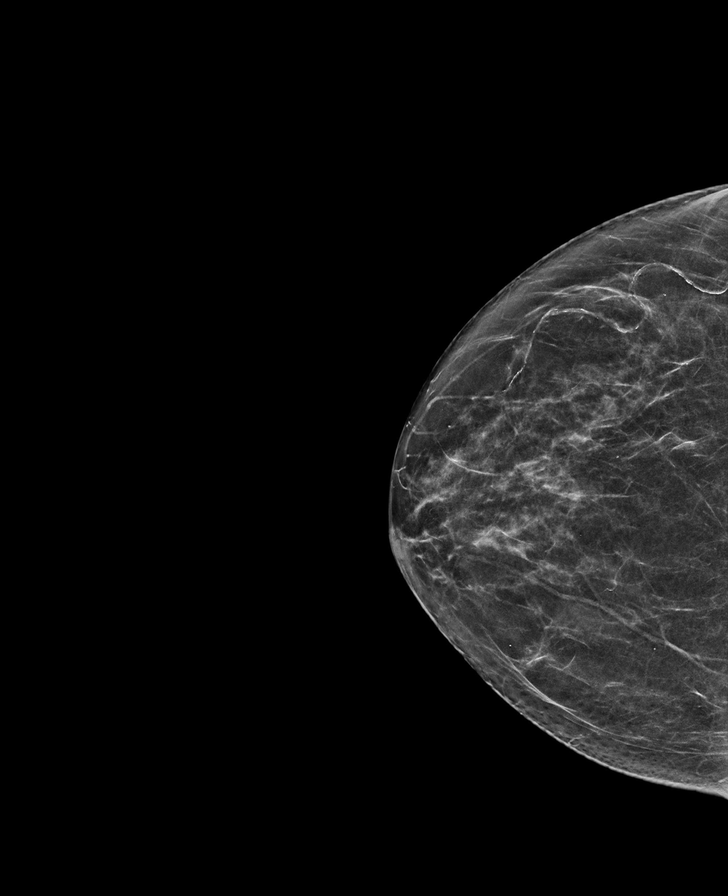

[L MLO synth-2D]
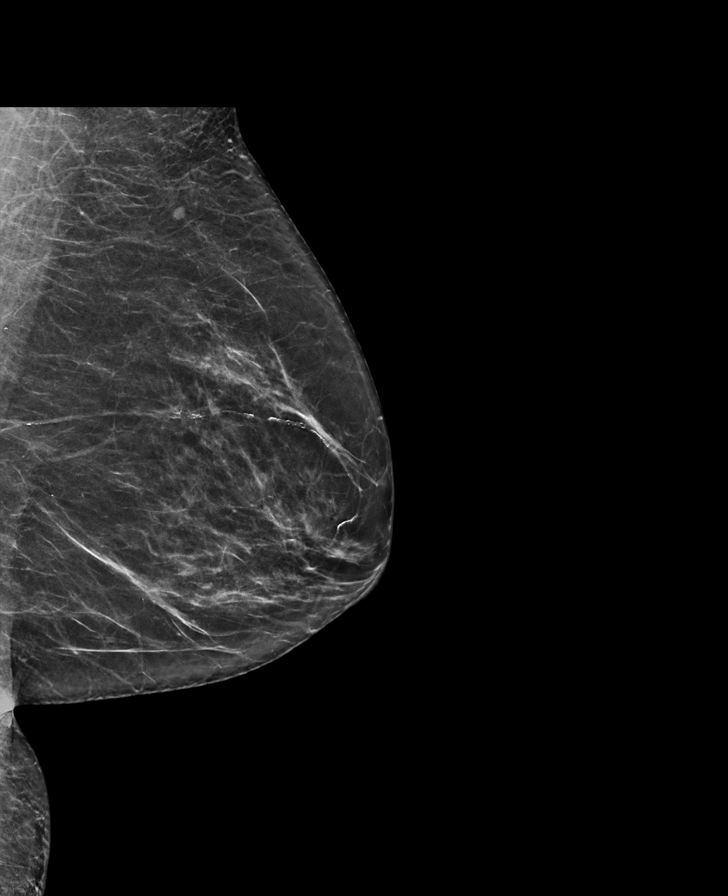

[L CC synth-2D]
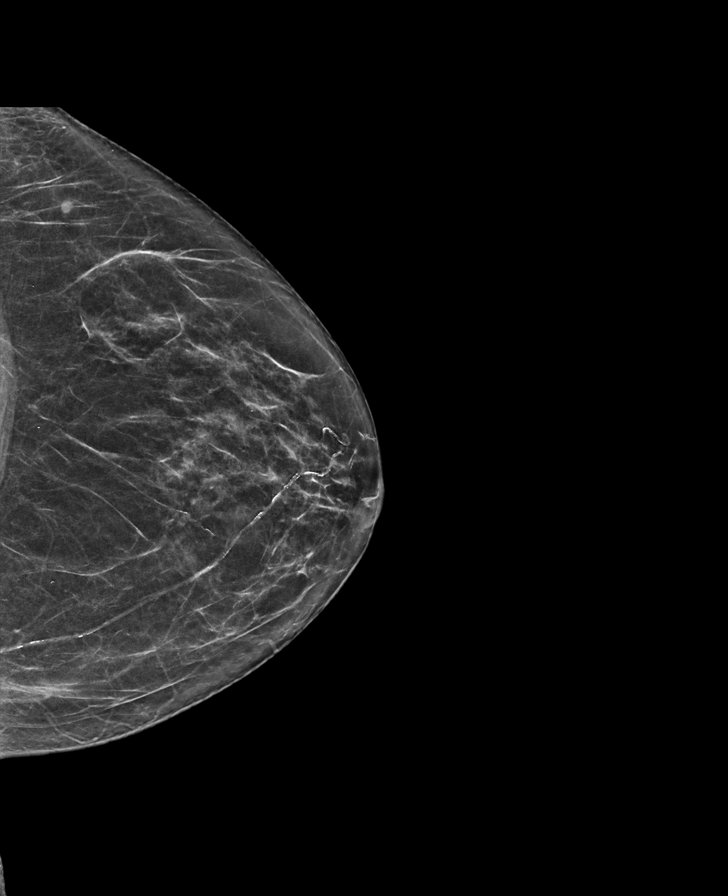

[R MLO synth-2D]
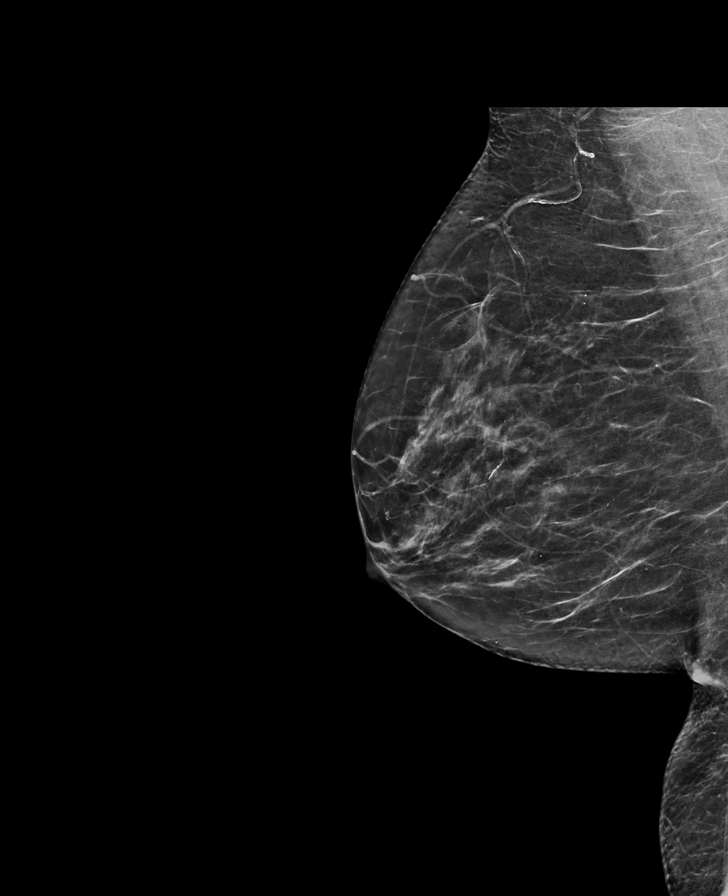

[R CC tomo · tomo slice 31/61.0]
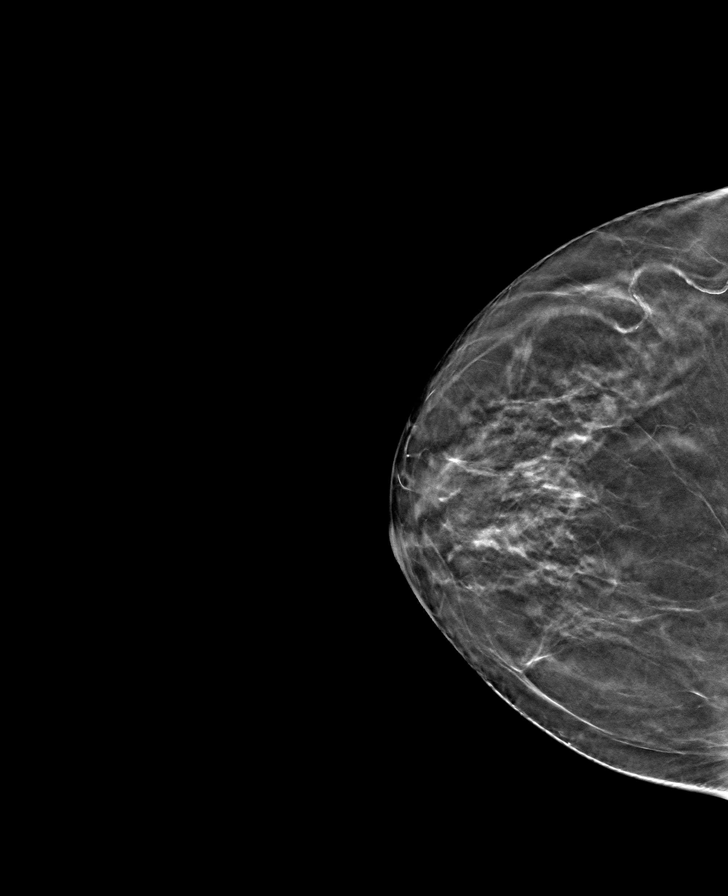

[L MLO tomo · tomo slice 33/65.0]
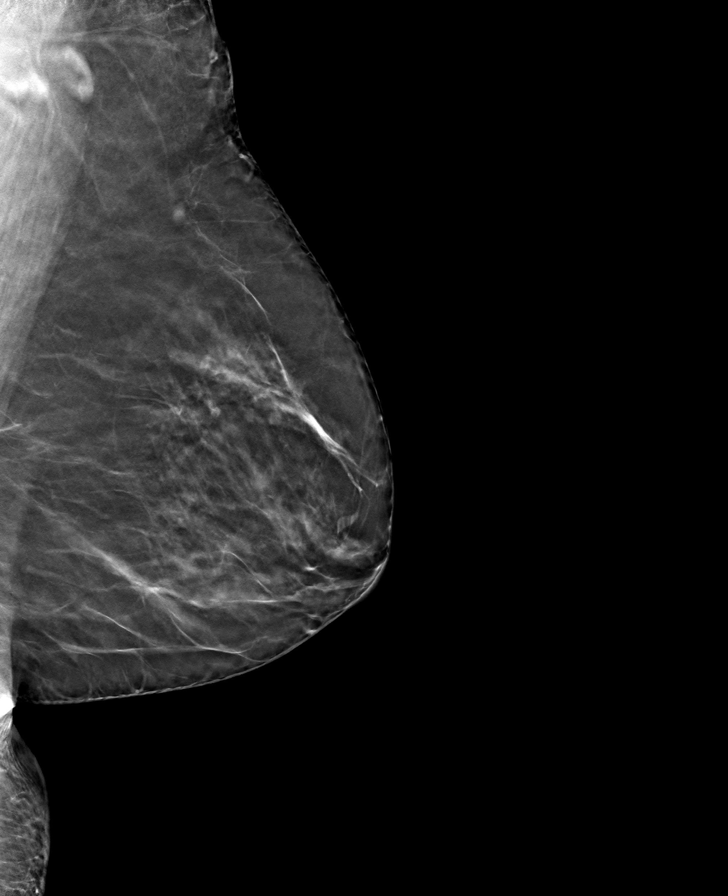

[L CC tomo · tomo slice 33/64.0]
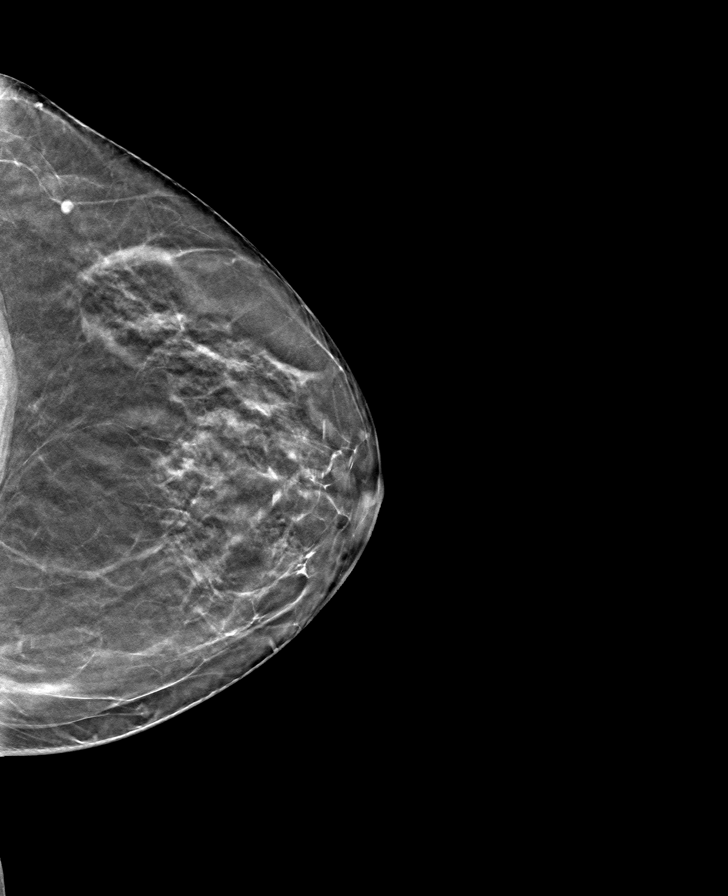

[R MLO tomo · tomo slice 33/65.0]
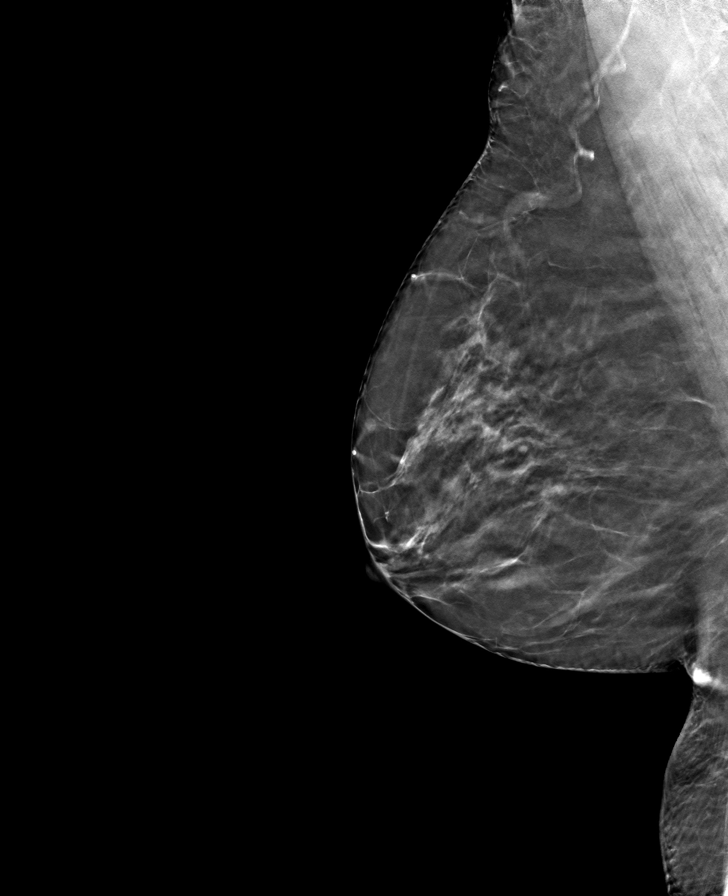

[8 of 24 positions shown; findings below may reference images not displayed]

ACR Breast Density Category b: There are scattered areas of
fibroglandular density.
FINDINGS: There are no findings suspicious for malignancy.
IMPRESSION: No mammographic evidence of malignancy. A result letter of this
screening mammogram will be mailed directly to the patient.

RECOMMENDATION:
Screening mammogram in one year. (Code:51-O-LD2)

BI-RADS CATEGORY  1: Negative.

## 2024-03-25 ENCOUNTER — Other Ambulatory Visit: Payer: Self-pay | Admitting: Family Medicine

## 2024-03-25 DIAGNOSIS — Z1231 Encounter for screening mammogram for malignant neoplasm of breast: Secondary | ICD-10-CM

## 2024-04-01 ENCOUNTER — Ambulatory Visit
Admission: RE | Admit: 2024-04-01 | Discharge: 2024-04-01 | Disposition: A | Discharge status: Home / Self Care | Source: Ambulatory Visit | Attending: Family Medicine | Admitting: Family Medicine

## 2024-04-01 DIAGNOSIS — Z1231 Encounter for screening mammogram for malignant neoplasm of breast: Secondary | ICD-10-CM | POA: Insufficient documentation
